# Patient Record
Sex: Male | Born: 2000 | Race: Black or African American | Hispanic: No | Marital: Single
Health system: Southern US, Community
[De-identification: ages and names within clinical notes are randomized; demographics above are authoritative.]

---

## 2019-06-30 ENCOUNTER — Encounter (HOSPITAL_COMMUNITY): Payer: Self-pay | Admitting: Emergency Medicine

## 2019-06-30 ENCOUNTER — Emergency Department (HOSPITAL_COMMUNITY)
Admission: EM | Admit: 2019-06-30 | Discharge: 2019-06-30 | Discharge status: Against Medical Advice | Payer: 59 | Attending: Emergency Medicine | Admitting: Emergency Medicine

## 2019-06-30 ENCOUNTER — Other Ambulatory Visit: Payer: Self-pay

## 2019-06-30 ENCOUNTER — Emergency Department (HOSPITAL_COMMUNITY): Payer: 59

## 2019-06-30 ENCOUNTER — Encounter (HOSPITAL_COMMUNITY): Payer: Self-pay

## 2019-06-30 ENCOUNTER — Emergency Department (HOSPITAL_COMMUNITY)
Admission: EM | Admit: 2019-06-30 | Discharge: 2019-06-30 | Disposition: A | Discharge status: Home / Self Care | Payer: 59 | Source: Home / Self Care | Attending: Emergency Medicine | Admitting: Emergency Medicine

## 2019-06-30 DIAGNOSIS — Z5329 Procedure and treatment not carried out because of patient's decision for other reasons: Secondary | ICD-10-CM | POA: Diagnosis not present

## 2019-06-30 DIAGNOSIS — S62316A Displaced fracture of base of fifth metacarpal bone, right hand, initial encounter for closed fracture: Secondary | ICD-10-CM | POA: Insufficient documentation

## 2019-06-30 DIAGNOSIS — Y9289 Other specified places as the place of occurrence of the external cause: Secondary | ICD-10-CM | POA: Insufficient documentation

## 2019-06-30 DIAGNOSIS — Y9389 Activity, other specified: Secondary | ICD-10-CM | POA: Insufficient documentation

## 2019-06-30 DIAGNOSIS — S62339A Displaced fracture of neck of unspecified metacarpal bone, initial encounter for closed fracture: Secondary | ICD-10-CM

## 2019-06-30 DIAGNOSIS — Y999 Unspecified external cause status: Secondary | ICD-10-CM | POA: Insufficient documentation

## 2019-06-30 DIAGNOSIS — S6991XA Unspecified injury of right wrist, hand and finger(s), initial encounter: Secondary | ICD-10-CM | POA: Diagnosis present

## 2019-06-30 NOTE — ED Notes (Signed)
Patient transported to X-ray 
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
## 2019-06-30 NOTE — ED Notes (Signed)
Pt walked out to the Triage desk stating he wanted to leave because "no one was helping."  Pt encouraged to stay but chose to leave.  PA discussed risks of leaving.
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
## 2019-06-30 NOTE — ED Notes (Signed)
Orthopedic Technician is not on until 12pm, will call for Ulnar Gutter Splint at that time.
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
## 2020-12-27 IMAGING — CR RIGHT LITTLE FINGER 2+V
3 series · 3 of 3 positions shown · non-contrast
Comparison: None.

CLINICAL DATA: 17-year-old male with trauma to the fifth
metacarpal.

EXAM:
RIGHT LITTLE FINGER 2+V

[x finger pa right]
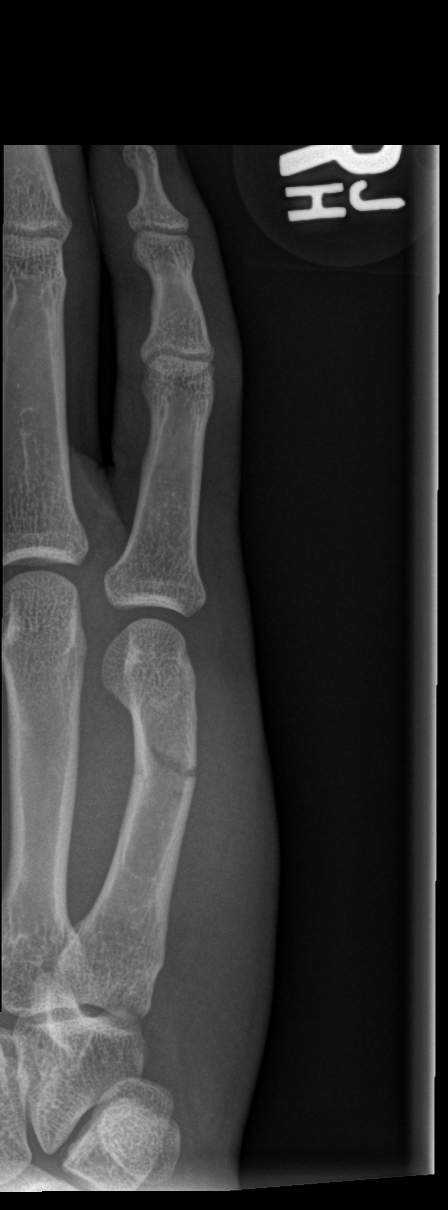

[x finger obl right]
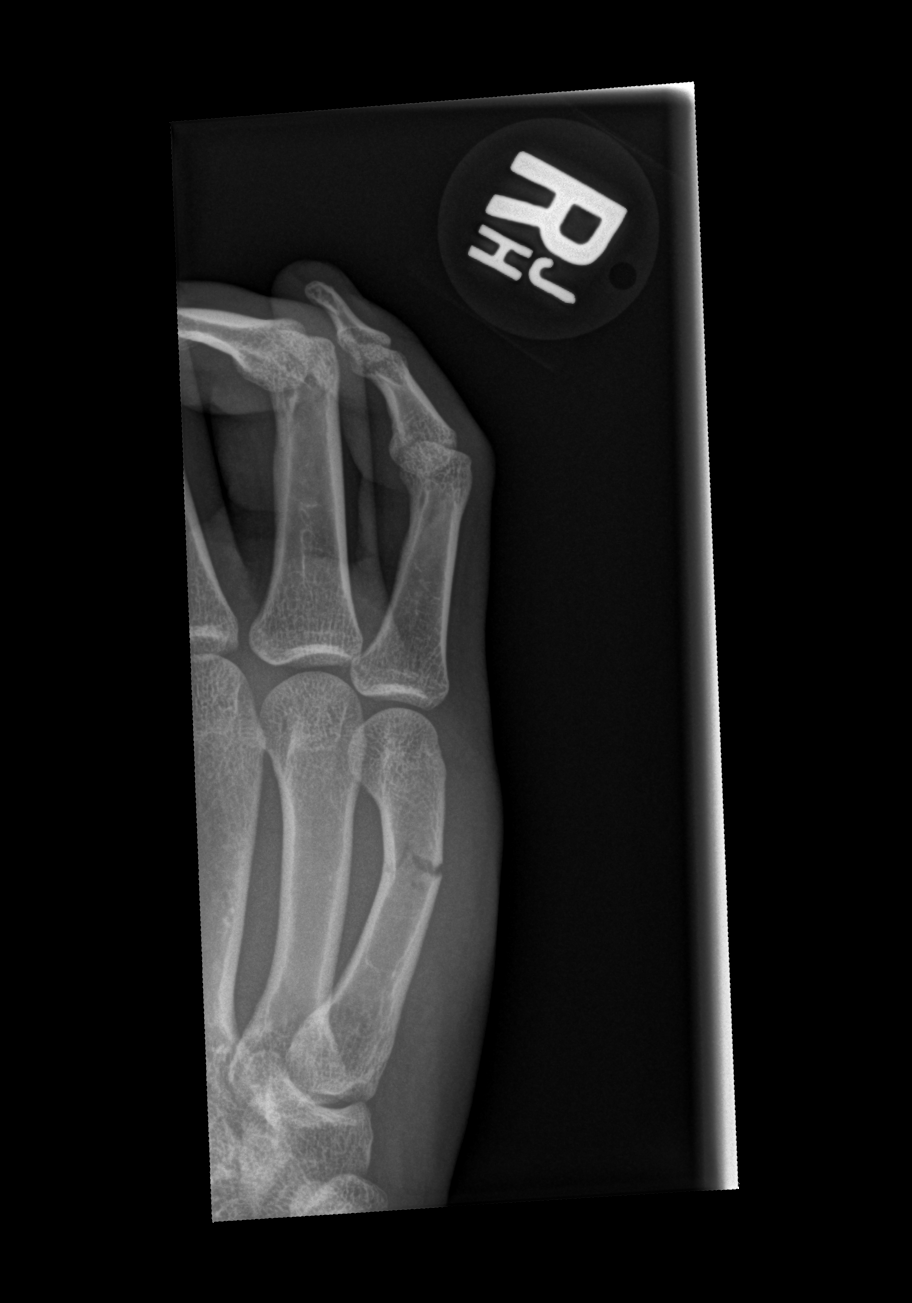

[x finger lat right]
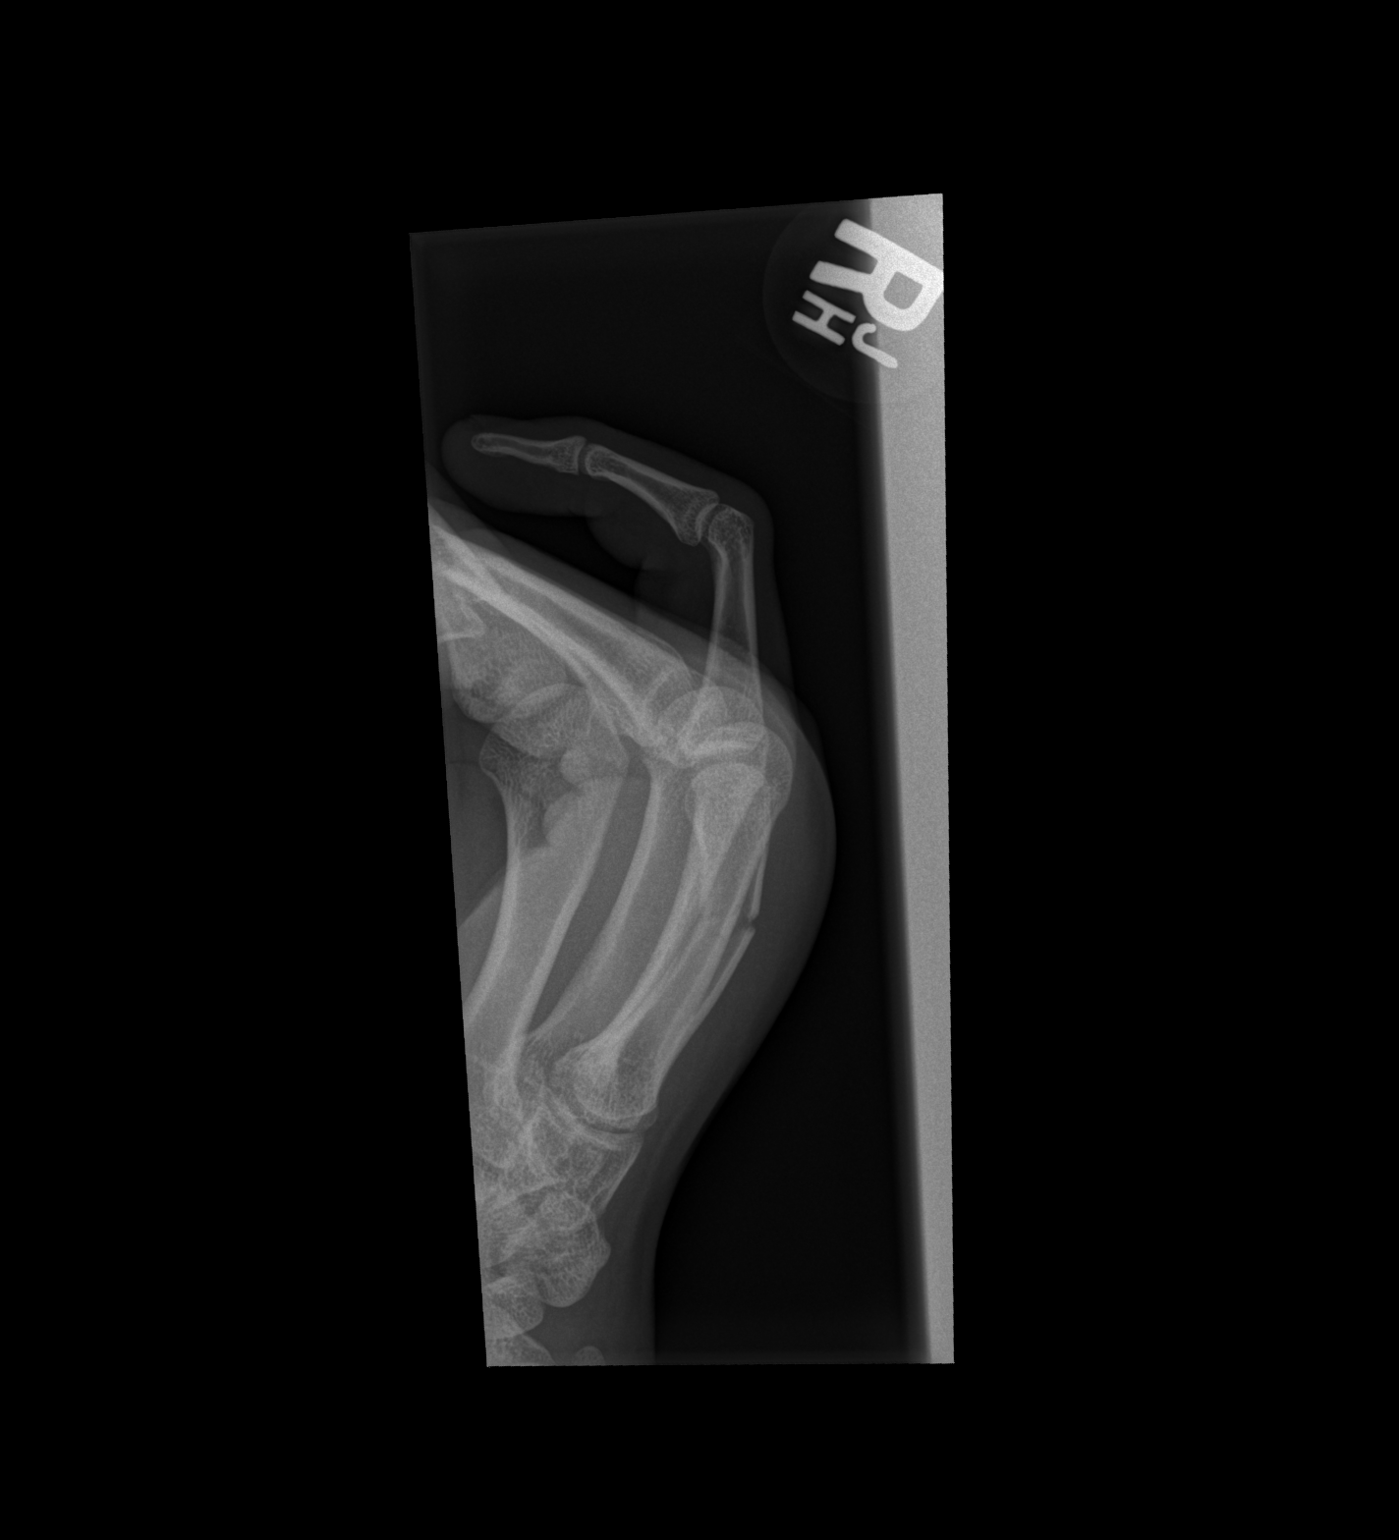

[3 of 3 positions shown; findings below may reference images not displayed]

FINDINGS: Mildly angulated transverse fracture of the distal fifth metacarpal
with mild volar angulation of the distal fracture fragment. No
dislocation. Adjacent soft tissue swelling noted. No radiopaque
foreign object.
IMPRESSION: Mildly angulated fracture of the distal fifth metacarpal.
